# Patient Record
Sex: Male | Born: 1935 | Race: White | Hispanic: No | Marital: Married | Smoking: Never smoker
Health system: Southern US, Community
[De-identification: ages and names within clinical notes are randomized; demographics above are authoritative.]

---

## 1999-04-28 ENCOUNTER — Ambulatory Visit (HOSPITAL_COMMUNITY): Admission: RE | Admit: 1999-04-28 | Discharge: 1999-04-28 | Payer: Self-pay | Admitting: Internal Medicine

## 2003-04-29 ENCOUNTER — Ambulatory Visit (HOSPITAL_COMMUNITY): Admission: RE | Admit: 2003-04-29 | Discharge: 2003-04-29 | Payer: Self-pay | Admitting: *Deleted

## 2004-09-29 ENCOUNTER — Ambulatory Visit (HOSPITAL_COMMUNITY): Admission: RE | Admit: 2004-09-29 | Discharge: 2004-09-29 | Payer: Self-pay | Admitting: Otolaryngology

## 2004-09-29 ENCOUNTER — Ambulatory Visit (HOSPITAL_BASED_OUTPATIENT_CLINIC_OR_DEPARTMENT_OTHER): Admission: RE | Admit: 2004-09-29 | Discharge: 2004-09-29 | Payer: Self-pay | Admitting: Otolaryngology

## 2005-01-18 ENCOUNTER — Emergency Department (HOSPITAL_COMMUNITY): Admission: EM | Admit: 2005-01-18 | Discharge: 2005-01-18 | Payer: Self-pay | Admitting: Emergency Medicine

## 2017-08-03 ENCOUNTER — Other Ambulatory Visit: Payer: Self-pay

## 2017-08-03 ENCOUNTER — Encounter: Payer: Self-pay | Admitting: Physician Assistant

## 2017-08-03 ENCOUNTER — Ambulatory Visit (INDEPENDENT_AMBULATORY_CARE_PROVIDER_SITE_OTHER): Payer: Medicare Other | Admitting: Physician Assistant

## 2017-08-03 VITALS — BP 134/77 | HR 74 | Temp 97.9°F | Resp 16 | Ht 71.0 in | Wt 163.2 lb

## 2017-08-03 DIAGNOSIS — M79675 Pain in left toe(s): Secondary | ICD-10-CM | POA: Diagnosis not present

## 2017-08-03 DIAGNOSIS — M109 Gout, unspecified: Secondary | ICD-10-CM | POA: Diagnosis not present

## 2017-08-03 MED ORDER — PREDNISONE 10 MG PO TABS: ORAL_TABLET | ORAL | 0 refills | Status: AC

## 2017-08-03 NOTE — Progress Notes (Signed)
   Jack Sparks  MRN: [id number] DOB: November 02, 1933  PCP: Burton Apleyoberts, Ronald, MD  Subjective:  Pt is an 84 year old male who presents to clinic for left great toe pain x 3 days. H/o gout attack Last gout attack 10 months ago.  He has had uric acid levels checked by PCP Dr. Burton Apleyonald Roberts. Level was elevated. He is not taking Allopurinol.  PCP is out of town at the moment.  ROS below.   Review of Systems  Constitutional: Negative for chills, diaphoresis, fatigue and fever.  Musculoskeletal: Positive for arthralgias and gait problem.  Skin: Positive for color change.  Neurological: Negative for weakness and numbness.    There are no active problems to display for this patient.   Current Outpatient Medications on File Prior to Visit  Medication Sig Dispense Refill  . acetaminophen (TYLENOL) 500 MG tablet Take 500 mg by mouth every 6 (six) hours as needed.    Marland Kitchen. levothyroxine (SYNTHROID, LEVOTHROID) 50 MCG tablet Take 50 mcg by mouth daily before breakfast.    . Multiple Vitamin (MULTIVITAMIN) tablet Take 1 tablet by mouth daily.    . Multiple Vitamins-Minerals (PRESERVISION AREDS 2 PO) Take by mouth.    . triazolam (HALCION) 0.25 MG tablet Take 0.25 mg by mouth at bedtime as needed for sleep.     No current facility-administered medications on file prior to visit.     No Known Allergies   Objective:  BP 134/77   Pulse 74   Temp (!) 97.5 F (36.4 C) (Oral)   Resp 16   Ht 5\' 11"  (1.803 m)   Wt 163 lb 3.2 oz (74 kg)   SpO2 100%   BMI 22.76 kg/m   Physical Exam  Constitutional: He is oriented to person, place, and time and well-developed, well-nourished, and in no distress. No distress.  Musculoskeletal:       Left foot: There is bony tenderness. There is normal range of motion, no tenderness and no swelling.       Feet:  Neurological: He is alert and oriented to person, place, and time. GCS score is 15.  Skin: Skin is warm and dry.  Psychiatric: Mood, memory, affect and  judgment normal.  Vitals reviewed.   Assessment and Plan :  1. Acute gout involving toe of left foot, unspecified cause 2. Pain of left great toe - predniSONE (DELTASONE) 10 MG tablet; Take 4 PO QAM x3days, 3 PO QAM x3days, 2 PO QAM x3days, 1 PO QAM x2days  Dispense: 29 tablet; Refill: 0 - Pt has h/o gout with elevated uric acid level. Not taking Allopurinol. Presents with MTP pain x 3 days. Will treat for gout flare. Advised pt to f/u with his PCP.   Marco CollieWhitney Lynnsie Linders, PA-C  [facility] 08/03/2017 8:13 AM

## 2017-08-03 NOTE — Patient Instructions (Addendum)
Start taking Prednisone 40 mg once daily for 3 days then taper the dose down over the next 8 days. Please refer to directions.  Have fun at the beach!    Gout Gout is painful swelling that can occur in some of your joints. Gout is a type of arthritis. This condition is caused by having too much uric acid in your body. Uric acid is a chemical that forms when your body breaks down substances called purines. Purines are important for building body proteins. When your body has too much uric acid, sharp crystals can form and build up inside your joints. This causes pain and swelling. Gout attacks can happen quickly and be very painful (acute gout). Over time, the attacks can affect more joints and become more frequent (chronic gout). Gout can also cause uric acid to build up under your skin and inside your kidneys. What are the causes? This condition is caused by too much uric acid in your blood. This can occur because:  Your kidneys do not remove enough uric acid from your blood. This is the most common cause.  Your body makes too much uric acid. This can occur with some cancers and cancer treatments. It can also occur if your body is breaking down too many red blood cells (hemolytic anemia).  You eat too many foods that are high in purines. These foods include organ meats and some seafood. Alcohol, especially beer, is also high in purines.  A gout attack may be triggered by trauma or stress. What increases the risk? This condition is more likely to develop in people who:  Have a family history of gout.  Are male and middle-aged.  Are male and have gone through menopause.  Are obese.  Frequently drink alcohol, especially beer.  Are dehydrated.  Lose weight too quickly.  Have an organ transplant.  Have lead poisoning.  Take certain medicines, including aspirin, cyclosporine, diuretics, levodopa, and niacin.  Have kidney disease or psoriasis.  What are the signs or  symptoms? An attack of acute gout happens quickly. It usually occurs in just one joint. The most common place is the big toe. Attacks often start at night. Other joints that may be affected include joints of the feet, ankle, knee, fingers, wrist, or elbow. Symptoms may include:  Severe pain.  Warmth.  Swelling.  Stiffness.  Tenderness. The affected joint may be very painful to touch.  Shiny, red, or purple skin.  Chills and fever.  Chronic gout may cause symptoms more frequently. More joints may be involved. You may also have white or yellow lumps (tophi) on your hands or feet or in other areas near your joints. How is this diagnosed? This condition is diagnosed based on your symptoms, medical history, and physical exam. You may have tests, such as:  Blood tests to measure uric acid levels.  Removal of joint fluid with a needle (aspiration) to look for uric acid crystals.  X-rays to look for joint damage.  How is this treated? Treatment for this condition has two phases: treating an acute attack and preventing future attacks. Acute gout treatment may include medicines to reduce pain and swelling, including:  NSAIDs.  Steroids. These are strong anti-inflammatory medicines that can be taken by mouth (orally) or injected into a joint.  Colchicine. This medicine relieves pain and swelling when it is taken soon after an attack. It can be given orally or through an IV tube.  Preventive treatment may include:  Daily use of smaller doses  of NSAIDs or colchicine.  Use of a medicine that reduces uric acid levels in your blood.  Changes to your diet. You may need to see a specialist about healthy eating (dietitian).  Follow these instructions at home: During a Gout Attack  If directed, apply ice to the affected area: ? Put ice in a plastic bag. ? Place a towel between your skin and the bag. ? Leave the ice on for 20 minutes, 2-3 times a day.  Rest the joint as much as  possible. If the affected joint is in your leg, you may be given crutches to use.  Raise (elevate) the affected joint above the level of your heart as often as possible.  Drink enough fluids to keep your urine clear or pale yellow.  Take over-the-counter and prescription medicines only as told by your health care provider.  Do not drive or operate heavy machinery while taking prescription pain medicine.  Follow instructions from your health care provider about eating or drinking restrictions.  Return to your normal activities as told by your health care provider. Ask your health care provider what activities are safe for you. Avoiding Future Gout Attacks  Follow a low-purine diet as told by your dietitian or health care provider. Avoid foods and drinks that are high in purines, including liver, kidney, anchovies, asparagus, herring, mushrooms, mussels, and beer.  Limit alcohol intake to no more than 1 drink a day for nonpregnant women and 2 drinks a day for men. One drink equals 12 oz of beer, 5 oz of wine, or 1 oz of hard liquor.  Maintain a healthy weight or lose weight if you are overweight. If you want to lose weight, talk with your health care provider. It is important that you do not lose weight too quickly.  Start or maintain an exercise program as told by your health care provider.  Drink enough fluids to keep your urine clear or pale yellow.  Take over-the-counter and prescription medicines only as told by your health care provider.  Keep all follow-up visits as told by your health care provider. This is important. Contact a health care provider if:  You have another gout attack.  You continue to have symptoms of a gout attack after10 days of treatment.  You have side effects from your medicines.  You have chills or a fever.  You have burning pain when you urinate.  You have pain in your lower back or belly. Get help right away if:  You have severe or uncontrolled  pain.  You cannot urinate. This information is not intended to replace advice given to you by your health care provider. Make sure you discuss any questions you have with your health care provider. Document Released: 05/05/2000 Document Revised: 10/14/2015 Document Reviewed: 02/18/2015 Elsevier Interactive Patient Education  [employer].  Thank you for coming in today. I hope you feel we met your needs.  Feel free to call PCP if you have any questions or further requests.  Please consider signing up for MyChart if you do not already have it, as this is a great way to communicate with me.  Best,  Whitney McVey, PA-C  IF you received an x-ray today, you will receive an invoice from [facility]. Please contact [facility] at [phone] with questions or concerns regarding your invoice.   IF you received labwork today, you will receive an invoice from [facility]. Please contact [facility] at [phone] with questions or concerns regarding your invoice.  Our billing staff will not be able to assist you with questions regarding bills from these companies.  You will be contacted with the lab results as soon as they are available. The fastest way to get your results is to activate your My Chart account. Instructions are located on the last page of this paperwork. If you have not heard from us regarding the results in 2 weeks, please contact this office.     

## 2017-08-15 ENCOUNTER — Other Ambulatory Visit: Payer: Self-pay | Admitting: Internal Medicine

## 2017-08-15 ENCOUNTER — Ambulatory Visit
Admission: RE | Admit: 2017-08-15 | Discharge: 2017-08-15 | Disposition: A | Discharge status: Home / Self Care | Payer: Medicare Other | Source: Ambulatory Visit | Attending: Internal Medicine | Admitting: Internal Medicine

## 2017-08-15 DIAGNOSIS — R52 Pain, unspecified: Secondary | ICD-10-CM

## 2018-09-26 IMAGING — CR DG FOOT COMPLETE 3+V*L*
3 series · 3 of 3 positions shown · non-contrast
Comparison: None.

CLINICAL DATA: Foot pain

EXAM:
LEFT FOOT - COMPLETE 3+ VIEW

[t foot ap left]
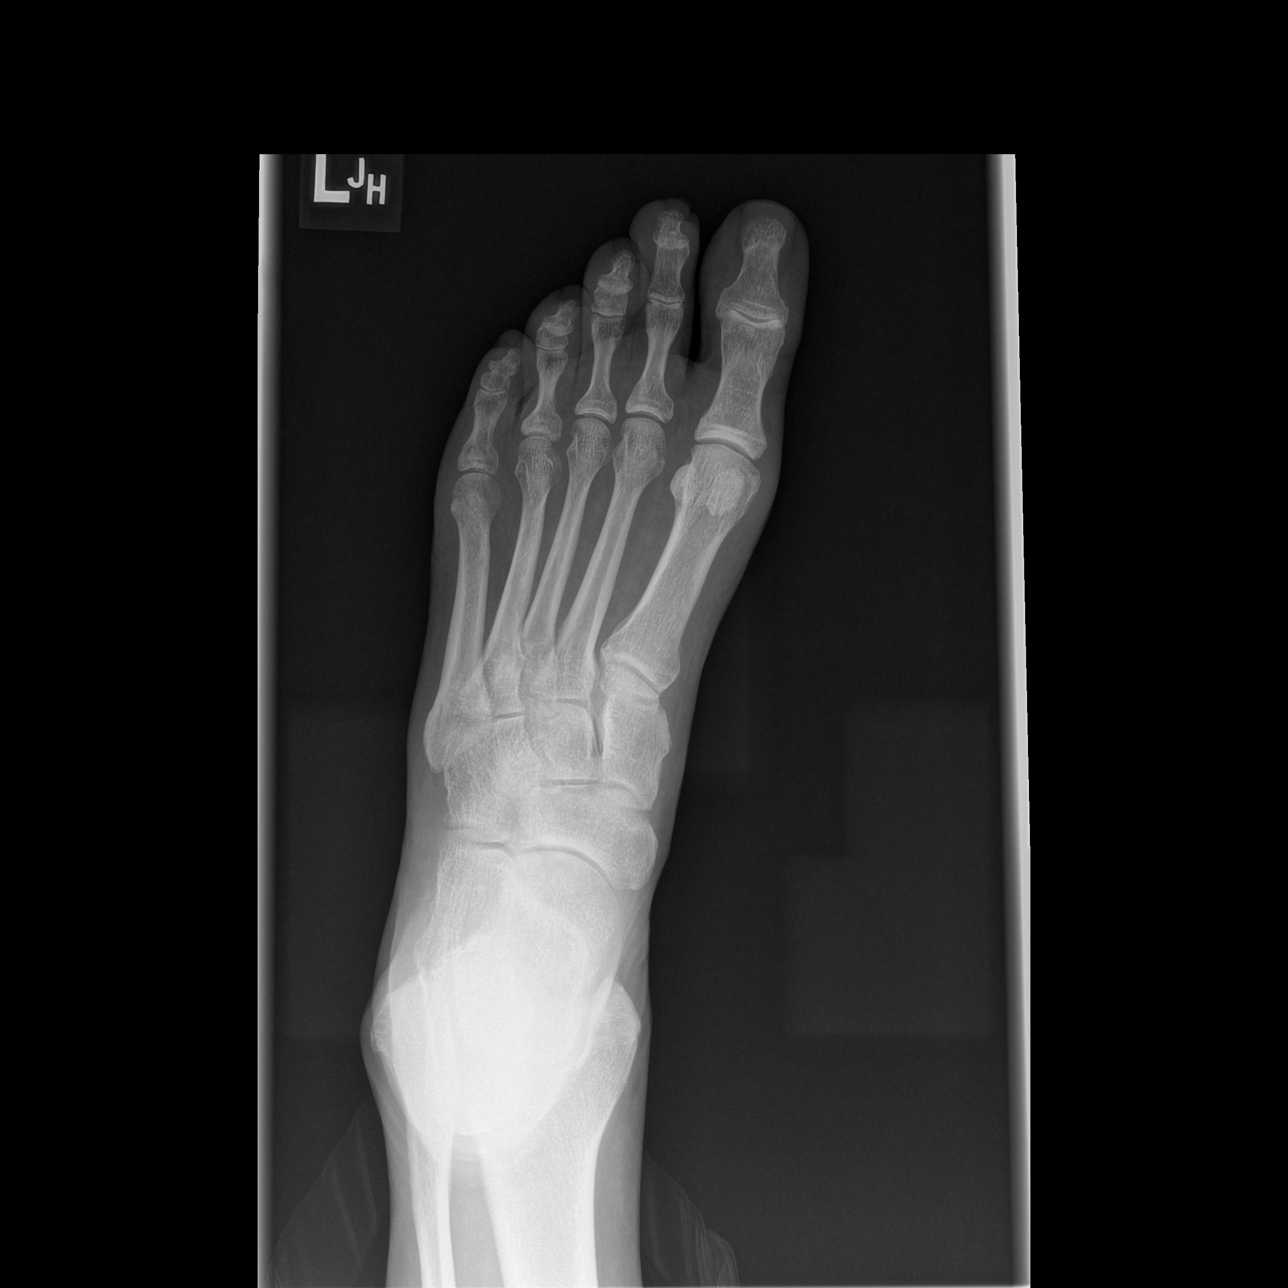

[t foot oblique left]
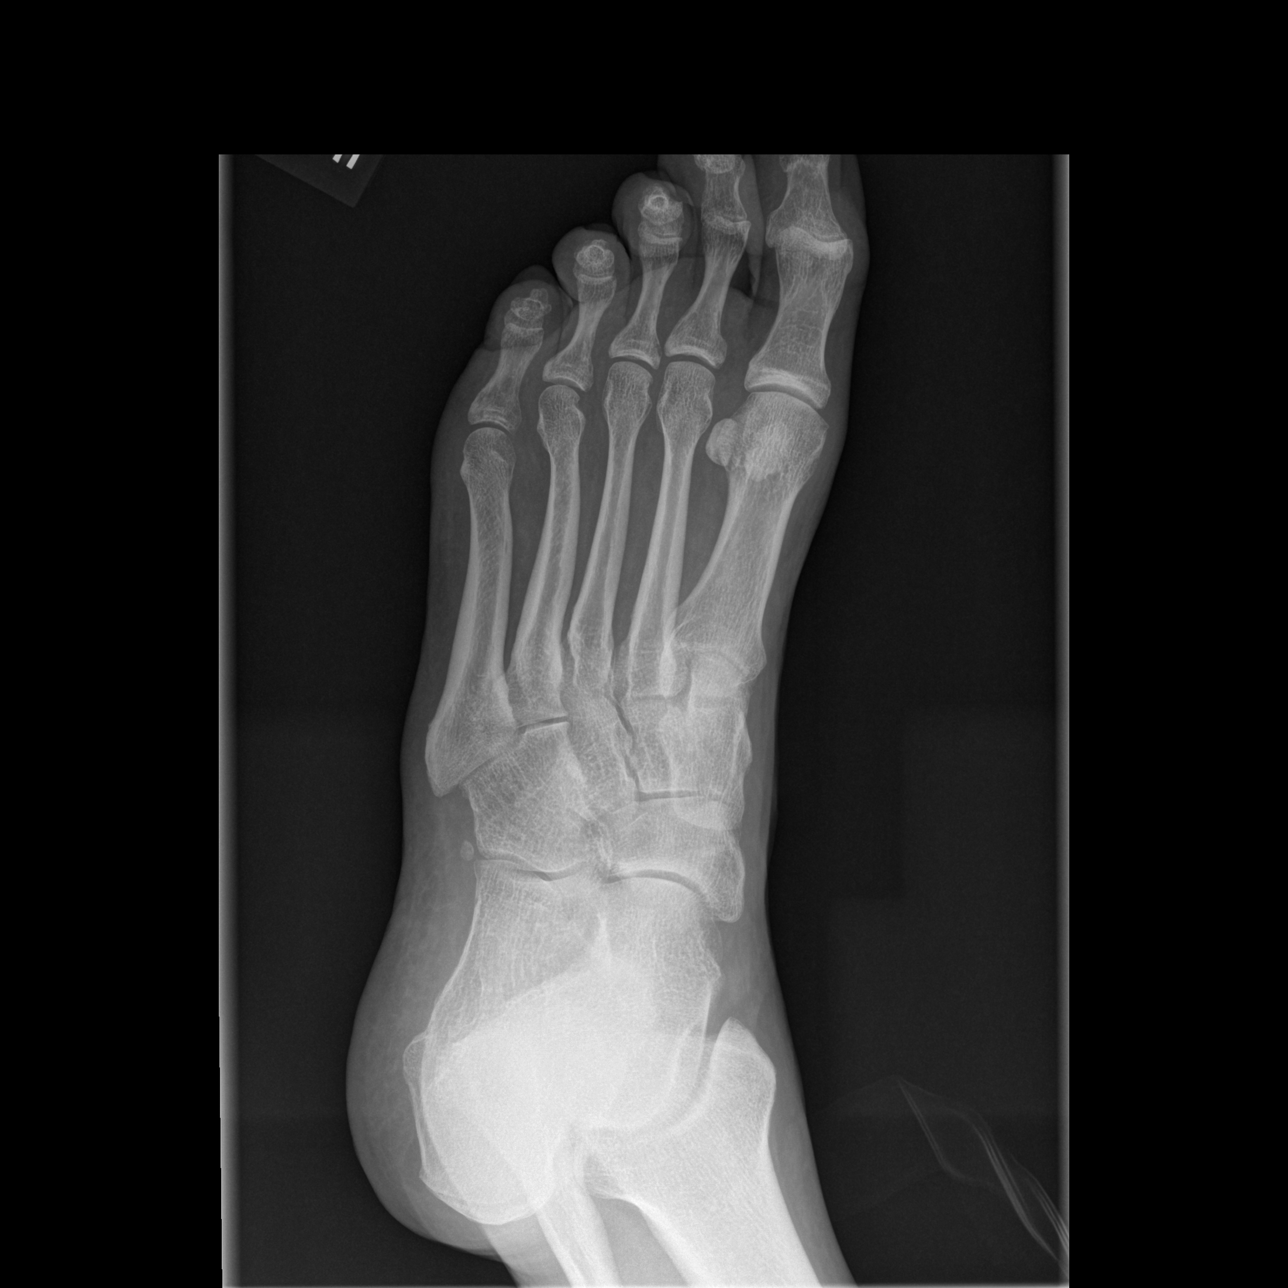

[t foot lat left]
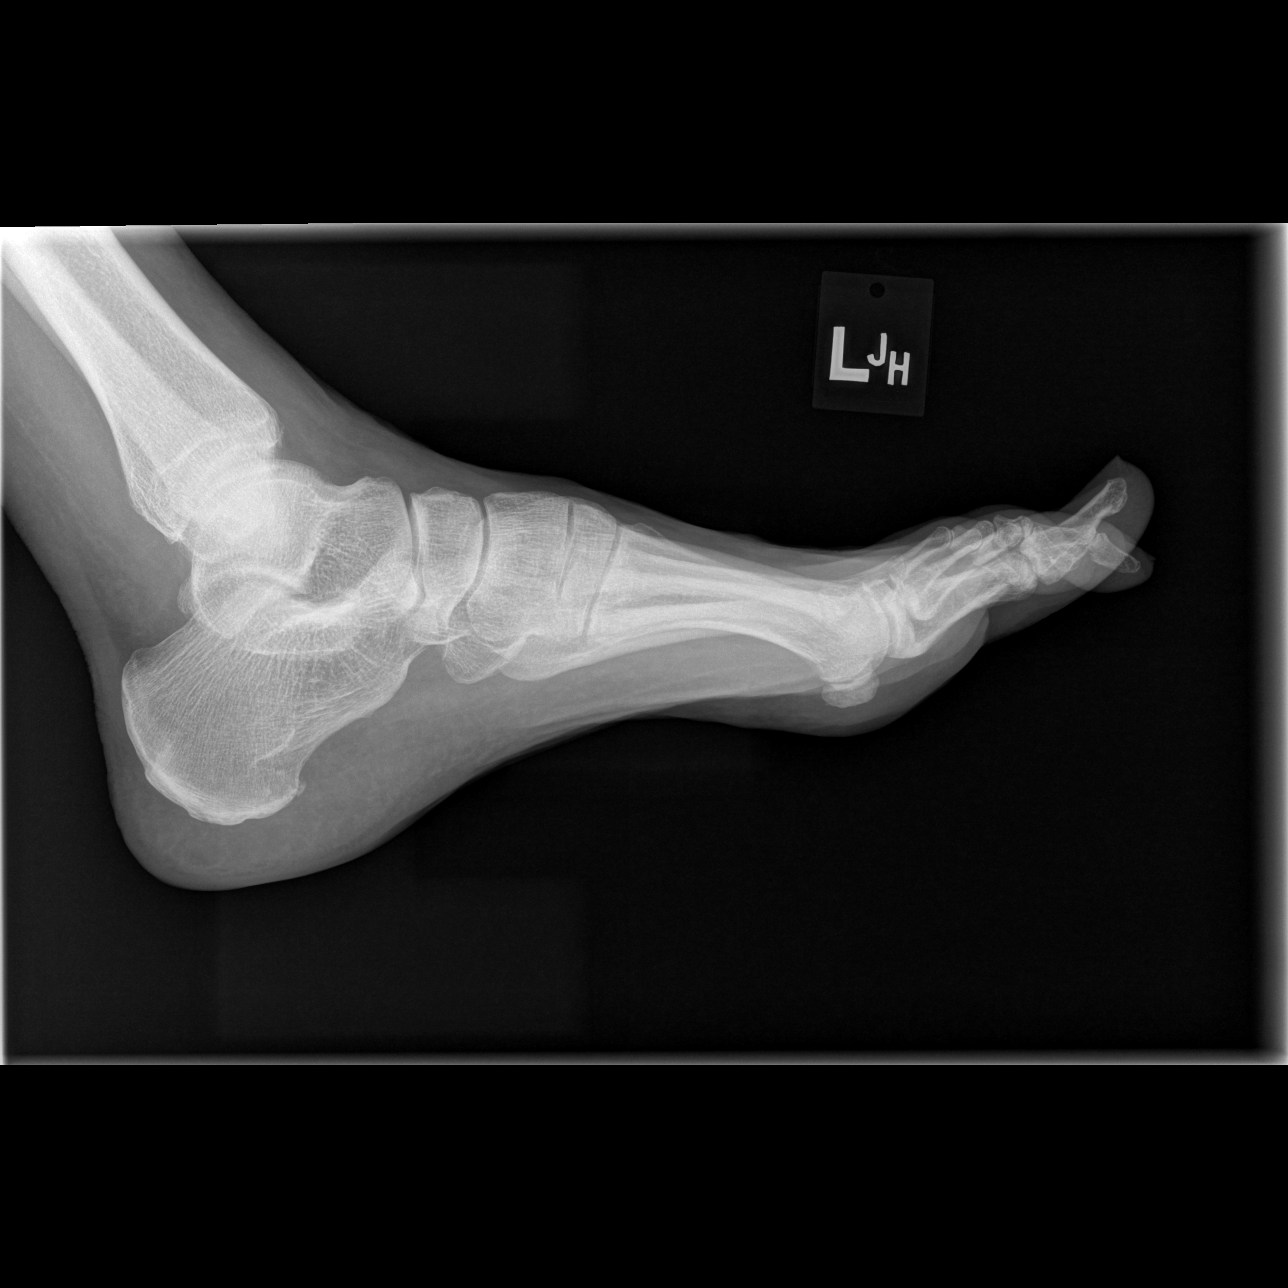

[3 of 3 positions shown; findings below may reference images not displayed]

FINDINGS: There is no evidence of fracture or dislocation. There is no
evidence of arthropathy or other focal bone abnormality. Soft
tissues are unremarkable.
IMPRESSION: Negative.

## 2023-01-13 ENCOUNTER — Other Ambulatory Visit (HOSPITAL_BASED_OUTPATIENT_CLINIC_OR_DEPARTMENT_OTHER): Payer: Self-pay

## 2023-01-15 ENCOUNTER — Other Ambulatory Visit (HOSPITAL_BASED_OUTPATIENT_CLINIC_OR_DEPARTMENT_OTHER): Payer: Self-pay

## 2023-02-08 ENCOUNTER — Other Ambulatory Visit (HOSPITAL_BASED_OUTPATIENT_CLINIC_OR_DEPARTMENT_OTHER): Payer: Self-pay

## 2023-02-08 MED ORDER — TRIAZOLAM 0.25 MG PO TABS
0.2500 mg | ORAL_TABLET | Freq: Every day | ORAL | 1 refills | Status: DC
  Filled 2023-02-08: qty 90, 90d supply, fill #0
  Filled 2023-05-02 – 2023-05-08 (×2): qty 90, 90d supply, fill #1

## 2023-05-02 ENCOUNTER — Other Ambulatory Visit (HOSPITAL_BASED_OUTPATIENT_CLINIC_OR_DEPARTMENT_OTHER): Payer: Self-pay

## 2023-05-02 ENCOUNTER — Other Ambulatory Visit: Payer: Self-pay

## 2023-05-04 ENCOUNTER — Other Ambulatory Visit (HOSPITAL_BASED_OUTPATIENT_CLINIC_OR_DEPARTMENT_OTHER): Payer: Self-pay

## 2023-05-09 ENCOUNTER — Other Ambulatory Visit (HOSPITAL_BASED_OUTPATIENT_CLINIC_OR_DEPARTMENT_OTHER): Payer: Self-pay

## 2023-06-28 ENCOUNTER — Other Ambulatory Visit (HOSPITAL_BASED_OUTPATIENT_CLINIC_OR_DEPARTMENT_OTHER): Payer: Self-pay

## 2023-07-25 ENCOUNTER — Other Ambulatory Visit (HOSPITAL_BASED_OUTPATIENT_CLINIC_OR_DEPARTMENT_OTHER): Payer: Self-pay

## 2023-07-25 MED ORDER — TRIAZOLAM 0.25 MG PO TABS
0.2500 mg | ORAL_TABLET | Freq: Every day | ORAL | 1 refills | Status: DC
  Filled 2023-07-25: qty 30, 30d supply, fill #0
  Filled 2023-07-25: qty 90, 90d supply, fill #0
  Filled 2023-08-04 – 2023-08-09 (×2): qty 30, 30d supply, fill #0
  Filled 2023-09-06: qty 30, 30d supply, fill #1
  Filled 2023-10-05: qty 30, 30d supply, fill #2
  Filled 2023-11-05: qty 30, 30d supply, fill #3
  Filled 2023-12-06 – 2023-12-07 (×2): qty 30, 30d supply, fill #4

## 2023-07-27 ENCOUNTER — Other Ambulatory Visit (HOSPITAL_BASED_OUTPATIENT_CLINIC_OR_DEPARTMENT_OTHER): Payer: Self-pay

## 2023-08-04 ENCOUNTER — Other Ambulatory Visit (HOSPITAL_BASED_OUTPATIENT_CLINIC_OR_DEPARTMENT_OTHER): Payer: Self-pay

## 2023-08-08 ENCOUNTER — Other Ambulatory Visit (HOSPITAL_BASED_OUTPATIENT_CLINIC_OR_DEPARTMENT_OTHER): Payer: Self-pay

## 2023-08-09 ENCOUNTER — Other Ambulatory Visit (HOSPITAL_BASED_OUTPATIENT_CLINIC_OR_DEPARTMENT_OTHER): Payer: Self-pay

## 2023-09-06 ENCOUNTER — Other Ambulatory Visit (HOSPITAL_BASED_OUTPATIENT_CLINIC_OR_DEPARTMENT_OTHER): Payer: Self-pay

## 2023-10-05 ENCOUNTER — Other Ambulatory Visit (HOSPITAL_BASED_OUTPATIENT_CLINIC_OR_DEPARTMENT_OTHER): Payer: Self-pay

## 2023-11-05 ENCOUNTER — Other Ambulatory Visit (HOSPITAL_BASED_OUTPATIENT_CLINIC_OR_DEPARTMENT_OTHER): Payer: Self-pay

## 2023-11-05 ENCOUNTER — Other Ambulatory Visit: Payer: Self-pay

## 2023-11-06 ENCOUNTER — Other Ambulatory Visit (HOSPITAL_BASED_OUTPATIENT_CLINIC_OR_DEPARTMENT_OTHER): Payer: Self-pay

## 2023-11-08 ENCOUNTER — Emergency Department (HOSPITAL_COMMUNITY)

## 2023-11-08 ENCOUNTER — Emergency Department (HOSPITAL_COMMUNITY)
Admission: EM | Admit: 2023-11-08 | Discharge: 2023-11-08 | Disposition: A | Discharge status: Home / Self Care | Attending: Emergency Medicine | Admitting: Emergency Medicine

## 2023-11-08 DIAGNOSIS — E039 Hypothyroidism, unspecified: Secondary | ICD-10-CM | POA: Insufficient documentation

## 2023-11-08 DIAGNOSIS — Z79890 Hormone replacement therapy (postmenopausal): Secondary | ICD-10-CM | POA: Diagnosis not present

## 2023-11-08 DIAGNOSIS — R42 Dizziness and giddiness: Secondary | ICD-10-CM | POA: Diagnosis present

## 2023-11-08 DIAGNOSIS — H81399 Other peripheral vertigo, unspecified ear: Secondary | ICD-10-CM

## 2023-11-08 LAB — CBC WITH DIFFERENTIAL/PLATELET
Abs Immature Granulocytes: 0.07 10*3/uL (ref 0.00–0.07)
Basophils Absolute: 0 10*3/uL (ref 0.0–0.1)
Basophils Relative: 0 %
Eosinophils Absolute: 0.1 10*3/uL (ref 0.0–0.5)
Eosinophils Relative: 1 %
HCT: 43.6 % (ref 39.0–52.0)
Hemoglobin: 14.5 g/dL (ref 13.0–17.0)
Immature Granulocytes: 1 %
Lymphocytes Relative: 17 %
Lymphs Abs: 1.2 10*3/uL (ref 0.7–4.0)
MCH: 31.5 pg (ref 26.0–34.0)
MCHC: 33.3 g/dL (ref 30.0–36.0)
MCV: 94.6 fL (ref 80.0–100.0)
Monocytes Absolute: 0.8 10*3/uL (ref 0.1–1.0)
Monocytes Relative: 11 %
Neutro Abs: 5.2 10*3/uL (ref 1.7–7.7)
Neutrophils Relative %: 70 %
Platelets: 162 10*3/uL (ref 150–400)
RBC: 4.61 MIL/uL (ref 4.22–5.81)
RDW: 13.6 % (ref 11.5–15.5)
WBC: 7.4 10*3/uL (ref 4.0–10.5)
nRBC: 0 % (ref 0.0–0.2)

## 2023-11-08 LAB — COMPREHENSIVE METABOLIC PANEL WITH GFR
ALT: 34 U/L (ref 0–44)
AST: 27 U/L (ref 15–41)
Albumin: 3.4 g/dL — ABNORMAL LOW (ref 3.5–5.0)
Alkaline Phosphatase: 51 U/L (ref 38–126)
Anion gap: 10 (ref 5–15)
BUN: 28 mg/dL — ABNORMAL HIGH (ref 8–23)
CO2: 24 mmol/L (ref 22–32)
Calcium: 9.2 mg/dL (ref 8.9–10.3)
Chloride: 107 mmol/L (ref 98–111)
Creatinine, Ser: 1.25 mg/dL — ABNORMAL HIGH (ref 0.61–1.24)
GFR, Estimated: 56 mL/min — ABNORMAL LOW (ref >60)
Glucose, Bld: 88 mg/dL (ref 70–99)
Potassium: 4.2 mmol/L (ref 3.5–5.1)
Sodium: 141 mmol/L (ref 135–145)
Total Bilirubin: 1.5 mg/dL — ABNORMAL HIGH (ref 0.0–1.2)
Total Protein: 6 g/dL — ABNORMAL LOW (ref 6.5–8.1)

## 2023-11-08 LAB — TSH: TSH: 1.962 u[IU]/mL (ref 0.350–4.500)

## 2023-11-08 LAB — MAGNESIUM: Magnesium: 2 mg/dL (ref 1.7–2.4)

## 2023-11-08 MED ORDER — MECLIZINE HCL 25 MG PO TABS
25.0000 mg | ORAL_TABLET | Freq: Once | ORAL | Status: AC
  Administered 2023-11-08: 25 mg via ORAL
  Filled 2023-11-08: qty 1

## 2023-11-08 MED ORDER — LACTATED RINGERS IV BOLUS
500.0000 mL | Freq: Once | INTRAVENOUS | Status: AC
  Administered 2023-11-08: 500 mL via INTRAVENOUS

## 2023-11-08 MED ORDER — MECLIZINE HCL 25 MG PO TABS: 25.0000 mg | ORAL_TABLET | Freq: Two times a day (BID) | ORAL | 0 refills | Status: AC | PRN

## 2023-11-08 MED ORDER — IOHEXOL 350 MG/ML SOLN
75.0000 mL | Freq: Once | INTRAVENOUS | Status: AC | PRN
  Administered 2023-11-08: 75 mL via INTRAVENOUS

## 2023-11-08 NOTE — ED Notes (Signed)
 Social work advised they will bring down a walker.

## 2023-11-08 NOTE — ED Notes (Signed)
 Patient states I don't want to wait for a walker anymore, they will give me one at [facility].  Patient wheeled out to lobby and to wife's car.

## 2023-11-08 NOTE — ED Provider Notes (Signed)
 [facility] AT [facility] Provider Note   CSN: [id number] Arrival date & time: 11/08/23  6213     Patient presents with: No chief complaint on file.   Jack Sparks is a 89 y.o. male.   Patient is an 89 year old male with no significant medical history except for hypothyroidism who is presenting today with complaints of dizziness.  He reports the dizziness started in the middle of the night.  He got up around 2:00 and noticed feeling slightly dizzy then when he got up.  Dizziness resolves when he lays down.  When he woke up at 5 it was still there and he laid back down.  Around 6:00 he went to the bathroom and still noticed feeling dizzy.  He reports holding onto the wall because he was not sure of his footing.  At 630 he told his wife to call the nurse to come check on him.  They did note that his pulse was irregular.  He denies any chest pain or shortness of breath.  He denies any abdominal pain nausea or vomiting.  He denies any dizziness at this time and it was only present with standing prior.  He takes no blood pressure medications and has been on triazolam  for years but that has not changed in frequency.  He denies any urinary symptoms.  No nausea vomiting or diarrhea.  He did play golf yesterday around 2 PM for about an hour but reports feeling fine while he was playing and afterwards.  Last night when he went to bed he felt his normal self.  He denies any unilateral numbness weakness.  No headaches, neck pain or visual changes.  The history is provided by the patient, the spouse and medical records.       Prior to Admission medications   Medication Sig Start Date End Date Taking? Authorizing Provider  meclizine (ANTIVERT) 25 MG tablet Take 1 tablet (25 mg total) by mouth 2 (two) times daily as needed for dizziness. 11/08/23  Yes Almond Army, MD  acetaminophen (TYLENOL) 500 MG tablet Take 500 mg by mouth every 6 (six) hours as needed.    [provider]  levothyroxine (SYNTHROID, LEVOTHROID) 50 MCG tablet Take 50 mcg by mouth daily before breakfast.    [provider]  Multiple Vitamin (MULTIVITAMIN) tablet Take 1 tablet by mouth daily.    [provider]  Multiple Vitamins-Minerals (PRESERVISION AREDS 2 PO) Take by mouth.    [provider]  predniSONE  (DELTASONE ) 10 MG tablet Take 4 PO QAM x3days, 3 PO QAM x3days, 2 PO QAM x3days, 1 PO QAM x2days 08/03/17   McVey, Claud Crumb, PA-C  triazolam  (HALCION ) 0.25 MG tablet Take 0.25 mg by mouth at bedtime as needed for sleep.    [provider]  triazolam  (HALCION ) 0.25 MG tablet Take 1 tablet (0.25 mg total) by mouth at bedtime. 07/10/23       Allergies: Patient has no known allergies.    Review of Systems  Updated Vital Signs BP 121/79   Pulse 88   Temp (!) 97.4 F (36.3 C)   Resp 15   SpO2 100%   Physical Exam Vitals and nursing note reviewed.  Constitutional:      General: He is not in acute distress.    Appearance: He is well-developed.  HENT:     Head: Normocephalic and atraumatic.   Eyes:     General: No visual field deficit.    Extraocular Movements:  Extraocular movements intact.     Conjunctiva/sclera: Conjunctivae normal.     Pupils: Pupils are equal, round, and reactive to light.    Cardiovascular:     Rate and Rhythm: Normal rate and regular rhythm.     Heart sounds: No murmur heard. Pulmonary:     Effort: Pulmonary effort is normal. No respiratory distress.     Breath sounds: Normal breath sounds. No wheezing or rales.  Abdominal:     General: There is no distension.     Palpations: Abdomen is soft.     Tenderness: There is no abdominal tenderness. There is no guarding or rebound.   Musculoskeletal:        General: No tenderness. Normal range of motion.     Cervical back: Normal range of motion and neck supple.   Skin:    General: Skin is warm and dry.     Findings: No erythema or rash.    Neurological:     Mental Status: He is alert and oriented to person, place, and time.     Cranial Nerves: Cranial nerves 2-12 are intact. No dysarthria or facial asymmetry.     Motor: Motor function is intact. No weakness or pronator drift.     Coordination: Coordination is intact. Coordination normal. Finger-Nose-Finger Test and Heel to [facility] Test normal.   Psychiatric:        Behavior: Behavior normal.     (all labs ordered are listed, but only abnormal results are displayed) Labs Reviewed  COMPREHENSIVE METABOLIC PANEL WITH GFR - Abnormal; Notable for the following components:      Result Value   BUN 28 (*)    Creatinine, Ser 1.25 (*)    Total Protein 6.0 (*)    Albumin 3.4 (*)    Total Bilirubin 1.5 (*)    GFR, Estimated 56 (*)    All other components within normal limits  CBC WITH DIFFERENTIAL/PLATELET  MAGNESIUM  TSH    EKG: EKG Interpretation Date/Time:  Thursday November 08 2023 09:16:38 EDT Ventricular Rate:  62 PR Interval:  228 QRS Duration:  108 QT Interval:  419 QTC Calculation: 426 R Axis:   45  Text Interpretation: Sinus rhythm Ventricular premature complex Prolonged PR interval Low voltage, precordial leads No previous tracing Confirmed by Almond Army ([id number]) on 11/08/2023 9:45:10 AM  Radiology: MR BRAIN WO CONTRAST Result Date: 11/08/2023 CLINICAL DATA:  Neuro deficit, acute, stroke suspected. New dizziness and ataxia. EXAM: MRI HEAD WITHOUT CONTRAST TECHNIQUE: Multiplanar, multiecho pulse sequences of the brain and surrounding structures were obtained without intravenous contrast. COMPARISON:  CTA head and neck 11/08/2023 FINDINGS: Brain: There is no evidence of an acute infarct, intracranial hemorrhage, mass, midline shift, or extra-axial fluid collection. Mild generalized cerebral atrophy is within normal limits for age. Scattered small T2 hyperintensities in the cerebral white matter bilaterally are nonspecific but compatible with mild chronic small  vessel ischemic disease which is also felt to be within normal limits for age. No focal cerebellar insult is evident. Vascular: Major intracranial vascular flow voids are preserved. Skull and upper cervical spine: Unremarkable bone marrow signal. Sinuses/Orbits: Bilateral cataract extraction. Paranasal sinuses and mastoid air cells are clear. Other: None. IMPRESSION: Unremarkable appearance of the brain for age. Electronically Signed   By: Aundra Lee M.D.   On: 11/08/2023 15:48   CT ANGIO HEAD NECK W WO CM Result Date: 11/08/2023 CLINICAL DATA:  Vertigo, central. EXAM: CT ANGIOGRAPHY HEAD AND NECK WITH AND WITHOUT CONTRAST TECHNIQUE: Multidetector CT imaging  of the head and neck was performed using the standard protocol during bolus administration of intravenous contrast. Multiplanar CT image reconstructions and MIPs were obtained to evaluate the vascular anatomy. Carotid stenosis measurements (when applicable) are obtained utilizing NASCET criteria, using the distal internal carotid diameter as the denominator. RADIATION DOSE REDUCTION: This exam was performed according to the departmental dose-optimization program which includes automated exposure control, adjustment of the mA and/or kV according to patient size and/or use of iterative reconstruction technique. CONTRAST:  75mL OMNIPAQUE IOHEXOL 350 MG/ML SOLN COMPARISON:  None Available. FINDINGS: CT HEAD FINDINGS Brain: There is no evidence of an acute infarct, intracranial hemorrhage, hydrocephalus, mass, midline shift, or extra-axial fluid collection. Mild cerebral atrophy is within normal limits for age. Vascular: No hyperdense vessel. Skull: No fracture or suspicious lesion. Sinuses/Orbits: Paranasal sinuses and mastoid air cells are clear. Bilateral cataract extraction. Other: None. Review of the MIP images confirms the above findings CTA NECK FINDINGS Aortic arch: Standard branching with widely patent arch vessel origins. Right carotid system: Patent  without evidence of stenosis or dissection. Left carotid system: Patent without evidence of stenosis or dissection. Vertebral arteries: Patent with the left being moderately to strongly dominant. Calcified plaque at the left vertebral artery origin results in mild stenosis. Skeleton: Widespread advanced disc degeneration in the cervical spine. Asymmetrically advanced right facet arthrosis at C4-5 with grade 1 anterolisthesis. Grade 1 retrolisthesis of C3 on C4. Other neck: No evidence of cervical lymphadenopathy or mass. Upper chest: No mass or consolidation in the included lung apices. Review of the MIP images confirms the above findings CTA HEAD FINDINGS Anterior circulation: The internal carotid arteries are widely patent from skull base to carotid termini. ACAs and MCAs are patent without evidence of a proximal branch occlusion or significant proximal stenosis. No aneurysm is identified. Posterior circulation: The intracranial vertebral arteries are widely patent to the basilar. Patent left PICA, right AICA, and bilateral SCA origins are visualized. There is a small left posterior communicating artery. Both PCAs are patent without evidence of a significant proximal stenosis. No aneurysm is identified. Venous sinuses: As permitted by contrast timing, patent. Anatomic variants: None. Review of the MIP images confirms the above findings IMPRESSION: 1. No evidence of acute intracranial abnormality. 2. Minimal atherosclerosis in the head and neck without a large vessel occlusion or high-grade proximal stenosis. 3. Mild left vertebral artery origin stenosis. Electronically Signed   By: Aundra Lee M.D.   On: 11/08/2023 14:16   DG Chest Port 1 View Result Date: 11/08/2023 CLINICAL DATA:  Dizziness. EXAM: PORTABLE CHEST 1 VIEW COMPARISON:  None Available. FINDINGS: The heart size and mediastinal contours are within normal limits. No focal consolidation, pleural effusion, or pneumothorax. Levocurvature of the lower  thoracic spine. No acute osseous abnormality. IMPRESSION: No acute cardiopulmonary findings. Electronically Signed   By: Mannie Seek M.D.   On: 11/08/2023 10:36     Procedures   Medications Ordered in the ED  lactated ringers bolus 500 mL (0 mLs Intravenous Stopped 11/08/23 1100)  meclizine (ANTIVERT) tablet 25 mg (25 mg Oral Given 11/08/23 1219)  iohexol (OMNIPAQUE) 350 MG/ML injection 75 mL (75 mLs Intravenous Contrast Given 11/08/23 1330)                                    Medical Decision Making Amount and/or Complexity of Data Reviewed Labs: ordered. Decision-making details documented in ED Course. Radiology: ordered and  independent interpretation performed. Decision-making details documented in ED Course. ECG/medicine tests: ordered and independent interpretation performed. Decision-making details documented in ED Course.  Risk Prescription drug management.   Pt presenting today with a complaint that caries a high risk for morbidity and mortality. Here today with complaint of dizziness.  Concern for peripheral vertigo versus central versus electrolyte abnormalities or thyroid dysfunction.  Patient is having frequent PVCs but I independently interpreted patient's EKG and he is in a sinus rhythm with no other acute findings.  Patient is on continuous cardiac monitoring and other than PVCs has no evidence of dysrhythmia.  Low suspicion for acute abdominal cause and lower suspicion for ACS, PE, infectious etiology.  No findings on exam consistent with stroke at this time. Patient given small bolus of IV fluids, blood pressure is normal.  Labs pending  4:01 PM I independently interpreted patient's labs and CBC, BMP, thyroid all within normal limits.  I have independently visualized and interpreted pt's images today.  CXR wnl.  Attempted to walk patient.  With sitting up he reported feeling dizzy however when attempted to walk him patient is ataxic.  He is holding onto the bed he  cannot walk independently which is completely off of his baseline.  Concern for possible central vertigo.  CTA of the head and neck and MRI are pending.  4:01 PM MRI was negative for acute pathology, CTA minimal atherosclerosis and no acute findings.  After meclizine patient does report feeling better.  He is now able to get out of the bed and does have some minimal dizziness but is able to ambulate.  Will send him home with a walker just for stability.  Discussed with he and his wife reasons to return as well as close follow-up with his PCP.     Final diagnoses:  Peripheral vertigo, unspecified laterality    ED Discharge Orders          Ordered    meclizine (ANTIVERT) 25 MG tablet  2 times daily PRN        11/08/23 1600               Almond Army, MD 11/08/23 1601

## 2023-11-08 NOTE — ED Triage Notes (Signed)
 Pt BIB [facility] from home cause he woke up dizzy.  Room started spinning while eating breakfast.  Dizziness resolves when laying down.    HR 60 with freq PVC's.  Went golfing in heat yesterday.  No other complaints.  All other VSS, ECG unremarkable.

## 2023-11-08 NOTE — ED Notes (Signed)
Patient placed onto cardiac monitor

## 2023-11-08 NOTE — Care Management (Signed)
 Transition of Care [facility]) - Emergency Department Mini Assessment   Patient Details  Name: Jack Sparks MRN: [id number] Date of Birth: February 19, 1934  [facility] CM/SW Contact:    Ronni Colace, RN Phone Number: 11/08/2023, 4:23 PM   Clinical Narrative: Patient presented with dizziness, walker ordered from [employer]. They will bring it to the bedside ASAP   ED Mini Assessment: What brought you to the Emergency Department? : Diziness  Barriers to Discharge: ED DME delivery  Barrier interventions: [employer] will bring walker ASAP     Interventions which prevented an admission or readmission: DME Provided    Patient Contact and Communications        ,                 Admission diagnosis:  dizziness Patient Active Problem List   Diagnosis Date Noted   Acute gout involving toe of left foot 08/03/2017   PCP:  Dudley Ghee, MD Pharmacy:   [facility] [place], [place] - [address] [facility] [address] [place] [address] Phone: [phone] Fax: [phone]

## 2023-11-08 NOTE — ED Notes (Signed)
 Pt in imaging

## 2023-11-08 NOTE — Discharge Instructions (Signed)
 The MRI and CAT scan today looked good.  All your blood work was normal.  You most likely have vertigo which is related to an inner ear problem.  You can have good times and bad times.  Take the medication if you need it only.  Use the walker as needed for stability until your symptoms completely resolve.  If you start having severe headache, vomiting, confusion return to the emergency room immediately.  If you develop any chest pain, shortness of breath or passing out you would need to return as well.

## 2023-12-07 ENCOUNTER — Other Ambulatory Visit: Payer: Self-pay

## 2023-12-07 ENCOUNTER — Other Ambulatory Visit (HOSPITAL_BASED_OUTPATIENT_CLINIC_OR_DEPARTMENT_OTHER): Payer: Self-pay

## 2024-01-07 ENCOUNTER — Other Ambulatory Visit (HOSPITAL_BASED_OUTPATIENT_CLINIC_OR_DEPARTMENT_OTHER): Payer: Self-pay

## 2024-01-07 MED ORDER — TRIAZOLAM 0.25 MG PO TABS
0.2500 mg | ORAL_TABLET | Freq: Every day | ORAL | 0 refills | Status: DC
  Filled 2024-01-07: qty 30, 30d supply, fill #0

## 2024-02-07 ENCOUNTER — Other Ambulatory Visit (HOSPITAL_BASED_OUTPATIENT_CLINIC_OR_DEPARTMENT_OTHER): Payer: Self-pay

## 2024-02-07 MED ORDER — TRIAZOLAM 0.25 MG PO TABS
0.2500 mg | ORAL_TABLET | Freq: Every day | ORAL | 1 refills | Status: AC
  Filled 2024-02-07: qty 90, 90d supply, fill #0

## 2024-02-13 ENCOUNTER — Other Ambulatory Visit (HOSPITAL_BASED_OUTPATIENT_CLINIC_OR_DEPARTMENT_OTHER): Payer: Self-pay

## 2024-02-14 ENCOUNTER — Other Ambulatory Visit (HOSPITAL_BASED_OUTPATIENT_CLINIC_OR_DEPARTMENT_OTHER): Payer: Self-pay

## 2024-02-15 ENCOUNTER — Other Ambulatory Visit (HOSPITAL_BASED_OUTPATIENT_CLINIC_OR_DEPARTMENT_OTHER): Payer: Self-pay

## 2024-02-18 ENCOUNTER — Other Ambulatory Visit (HOSPITAL_BASED_OUTPATIENT_CLINIC_OR_DEPARTMENT_OTHER): Payer: Self-pay

## 2024-02-18 MED ORDER — TRIAZOLAM 0.25 MG PO TABS
0.5000 mg | ORAL_TABLET | Freq: Every day | ORAL | 5 refills | Status: AC
  Filled 2024-04-04: qty 62, 31d supply, fill #0
  Filled 2024-05-06: qty 62, 31d supply, fill #1
  Filled 2024-06-11: qty 62, 31d supply, fill #2

## 2024-04-04 ENCOUNTER — Other Ambulatory Visit (HOSPITAL_BASED_OUTPATIENT_CLINIC_OR_DEPARTMENT_OTHER): Payer: Self-pay

## 2024-05-06 ENCOUNTER — Other Ambulatory Visit (HOSPITAL_BASED_OUTPATIENT_CLINIC_OR_DEPARTMENT_OTHER): Payer: Self-pay

## 2024-06-11 ENCOUNTER — Other Ambulatory Visit (HOSPITAL_BASED_OUTPATIENT_CLINIC_OR_DEPARTMENT_OTHER): Payer: Self-pay
